# Patient Record
Sex: Female | Born: 1985
Health system: Southern US, Community
[De-identification: ages and names within clinical notes are randomized; demographics above are authoritative.]

## PROBLEM LIST (undated history)

## (undated) DIAGNOSIS — C539 Malignant neoplasm of cervix uteri, unspecified: Secondary | ICD-10-CM

## (undated) HISTORY — DX: Malignant neoplasm of cervix uteri, unspecified: C53.9

---

## 2019-09-02 ENCOUNTER — Ambulatory Visit: Payer: Self-pay

## 2020-02-01 DIAGNOSIS — K0889 Other specified disorders of teeth and supporting structures: Secondary | ICD-10-CM | POA: Diagnosis not present

## 2020-08-18 ENCOUNTER — Encounter: Payer: Self-pay | Admitting: Sports Medicine

## 2020-08-25 ENCOUNTER — Encounter: Payer: BC Managed Care – PPO | Admitting: Sports Medicine

## 2020-08-25 ENCOUNTER — Ambulatory Visit (INDEPENDENT_AMBULATORY_CARE_PROVIDER_SITE_OTHER): Payer: BC Managed Care – PPO

## 2020-08-25 ENCOUNTER — Ambulatory Visit: Payer: BC Managed Care – PPO | Admitting: Sports Medicine

## 2020-08-25 ENCOUNTER — Other Ambulatory Visit: Payer: Self-pay

## 2020-08-25 DIAGNOSIS — S8991XA Unspecified injury of right lower leg, initial encounter: Secondary | ICD-10-CM

## 2020-08-25 DIAGNOSIS — M1711 Unilateral primary osteoarthritis, right knee: Secondary | ICD-10-CM | POA: Insufficient documentation

## 2020-08-25 DIAGNOSIS — M25562 Pain in left knee: Secondary | ICD-10-CM | POA: Diagnosis not present

## 2020-08-25 DIAGNOSIS — M1712 Unilateral primary osteoarthritis, left knee: Secondary | ICD-10-CM | POA: Diagnosis not present

## 2020-08-25 DIAGNOSIS — Z09 Encounter for follow-up examination after completed treatment for conditions other than malignant neoplasm: Secondary | ICD-10-CM | POA: Diagnosis not present

## 2020-08-25 DIAGNOSIS — M25561 Pain in right knee: Secondary | ICD-10-CM | POA: Diagnosis not present

## 2020-08-25 DIAGNOSIS — W19XXXA Unspecified fall, initial encounter: Secondary | ICD-10-CM

## 2020-08-25 NOTE — Progress Notes (Signed)
    Procedures performed today:    None.  Independent interpretation of notes and tests performed by another provider:   None.  Brief History, Exam, Impression, and Recommendations:    Right knee injury This is a pleasant 35 year old female, 2 weeks ago she was on a boat, and excellently twisted her knee.  She had some pain, the next day she had significant swelling. Today on exam she has tenderness at the medial joint line, pain with terminal flexion, she has an effusion, the knee is to some degree unstable and does have a positive McMurray's sign. Due to current mechanical symptoms, positive McMurray's sign, joint line pain we agreed to proceed with x-rays and an MRI for concern for a meniscal tear. Continue over-the-counter NSAIDs as desired and follow-up with me to go over MRI results. If the small tear or chondral defect we can consider an injection, if larger meniscal tear or ACL injury we will need to refer for operative intervention.    ___________________________________________ Gwen Her. Dianah Field, M.D., ABFM., CAQSM. Primary Care and [place] Instructor of [place] of [facility] [employer]

## 2020-08-25 NOTE — Assessment & Plan Note (Signed)
This is a pleasant 35 year old female, 2 weeks ago she was on a boat, and excellently twisted her knee.  She had some pain, the next day she had significant swelling. Today on exam she has tenderness at the medial joint line, pain with terminal flexion, she has an effusion, the knee is to some degree unstable and does have a positive McMurray's sign. Due to current mechanical symptoms, positive McMurray's sign, joint line pain we agreed to proceed with x-rays and an MRI for concern for a meniscal tear. Continue over-the-counter NSAIDs as desired and follow-up with me to go over MRI results. If the small tear or chondral defect we can consider an injection, if larger meniscal tear or ACL injury we will need to refer for operative intervention.

## 2020-08-26 ENCOUNTER — Ambulatory Visit (INDEPENDENT_AMBULATORY_CARE_PROVIDER_SITE_OTHER): Payer: BC Managed Care – PPO

## 2020-08-26 DIAGNOSIS — S8991XA Unspecified injury of right lower leg, initial encounter: Secondary | ICD-10-CM

## 2020-08-26 DIAGNOSIS — M25561 Pain in right knee: Secondary | ICD-10-CM | POA: Diagnosis not present

## 2020-09-06 ENCOUNTER — Ambulatory Visit: Payer: BC Managed Care – PPO | Admitting: Sports Medicine

## 2020-09-06 ENCOUNTER — Other Ambulatory Visit: Payer: Self-pay

## 2020-09-06 ENCOUNTER — Ambulatory Visit: Payer: Self-pay

## 2020-09-06 DIAGNOSIS — S8991XD Unspecified injury of right lower leg, subsequent encounter: Secondary | ICD-10-CM

## 2020-09-06 DIAGNOSIS — M1711 Unilateral primary osteoarthritis, right knee: Secondary | ICD-10-CM

## 2020-09-06 MED ORDER — MELOXICAM 15 MG PO TABS: ORAL_TABLET | ORAL | 3 refills | Status: AC

## 2020-09-06 NOTE — Assessment & Plan Note (Addendum)
Kara Herring is a pleasant 35 year old female, we have been seeing her for knee pain, she recalls accidentally twisting her knee while on a boat, at the last visit we suspected a meniscal tear so we obtained an MRI, MRI only showed osteoarthritis. She used over-the-counter NSAIDs, switching to meloxicam, I have talked to her about an anti-inflammatory diet/Mediterranean diet. Today we also injected her knee. Return to see me in a month to see how things are going, viscosupplementation if no better.  She also plans to establish care with one of the primary care providers here, they can help her work aggressively on weight loss which will be crucial for saving her knee.

## 2020-09-06 NOTE — Progress Notes (Signed)
    Procedures performed today:    Procedure: Real-time Ultrasound Guided injection of the right knee Device: Samsung HS60  Verbal informed consent obtained.  Time-out conducted.  Noted no overlying erythema, induration, or other signs of local infection.  Skin prepped in a sterile fashion.  Local anesthesia: Topical Ethyl chloride.  With sterile technique and under real time ultrasound guidance:  Noted trace effusion, 1 cc Kenalog 40, 2 cc lidocaine, 2 cc bupivacaine injected easily Completed without difficulty  Advised to call if fevers/chills, erythema, induration, drainage, or persistent bleeding.  Images permanently stored and available for review in PACS.  Impression: Technically successful ultrasound guided injection.  Independent interpretation of notes and tests performed by another provider:   None.  Brief History, Exam, Impression, and Recommendations:    Primary osteoarthritis of right knee Kara Herring is a pleasant 35 year old female, we have been seeing her for knee pain, she recalls accidentally twisting her knee while on a boat, at the last visit we suspected a meniscal tear so we obtained an MRI, MRI only showed osteoarthritis. She used over-the-counter NSAIDs, switching to meloxicam, I have talked to her about an anti-inflammatory diet/Mediterranean diet. Today we also injected her knee. Return to see me in a month to see how things are going, viscosupplementation if no better.  She also plans to establish care with one of the primary care providers here, they can help her work aggressively on weight loss which will be crucial for saving her knee.    ___________________________________________ Gwen Her. Dianah Field, M.D., ABFM., CAQSM. Primary Care and [place] Instructor of [place] of [facility] of [employer]

## 2020-09-22 ENCOUNTER — Ambulatory Visit: Payer: BC Managed Care – PPO | Admitting: Medical-Surgical

## 2020-10-04 ENCOUNTER — Ambulatory Visit: Payer: BC Managed Care – PPO | Admitting: Sports Medicine

## 2020-10-13 ENCOUNTER — Ambulatory Visit: Payer: BC Managed Care – PPO | Admitting: Medical-Surgical

## 2020-11-20 DIAGNOSIS — J029 Acute pharyngitis, unspecified: Secondary | ICD-10-CM | POA: Diagnosis not present

## 2021-02-23 DIAGNOSIS — Z0100 Encounter for examination of eyes and vision without abnormal findings: Secondary | ICD-10-CM | POA: Diagnosis not present

## 2022-01-25 DIAGNOSIS — J019 Acute sinusitis, unspecified: Secondary | ICD-10-CM | POA: Diagnosis not present

## 2022-01-31 ENCOUNTER — Encounter: Payer: BC Managed Care – PPO | Admitting: Sports Medicine

## 2022-04-22 IMAGING — DX DG KNEE COMPLETE 4+V*R*
4 series · 4 of 4 positions shown · non-contrast
Comparison: None.

CLINICAL DATA: Right knee pain following fall, initial encounter

EXAM:
RIGHT KNEE - COMPLETE 4+ VIEW

[knee lat]
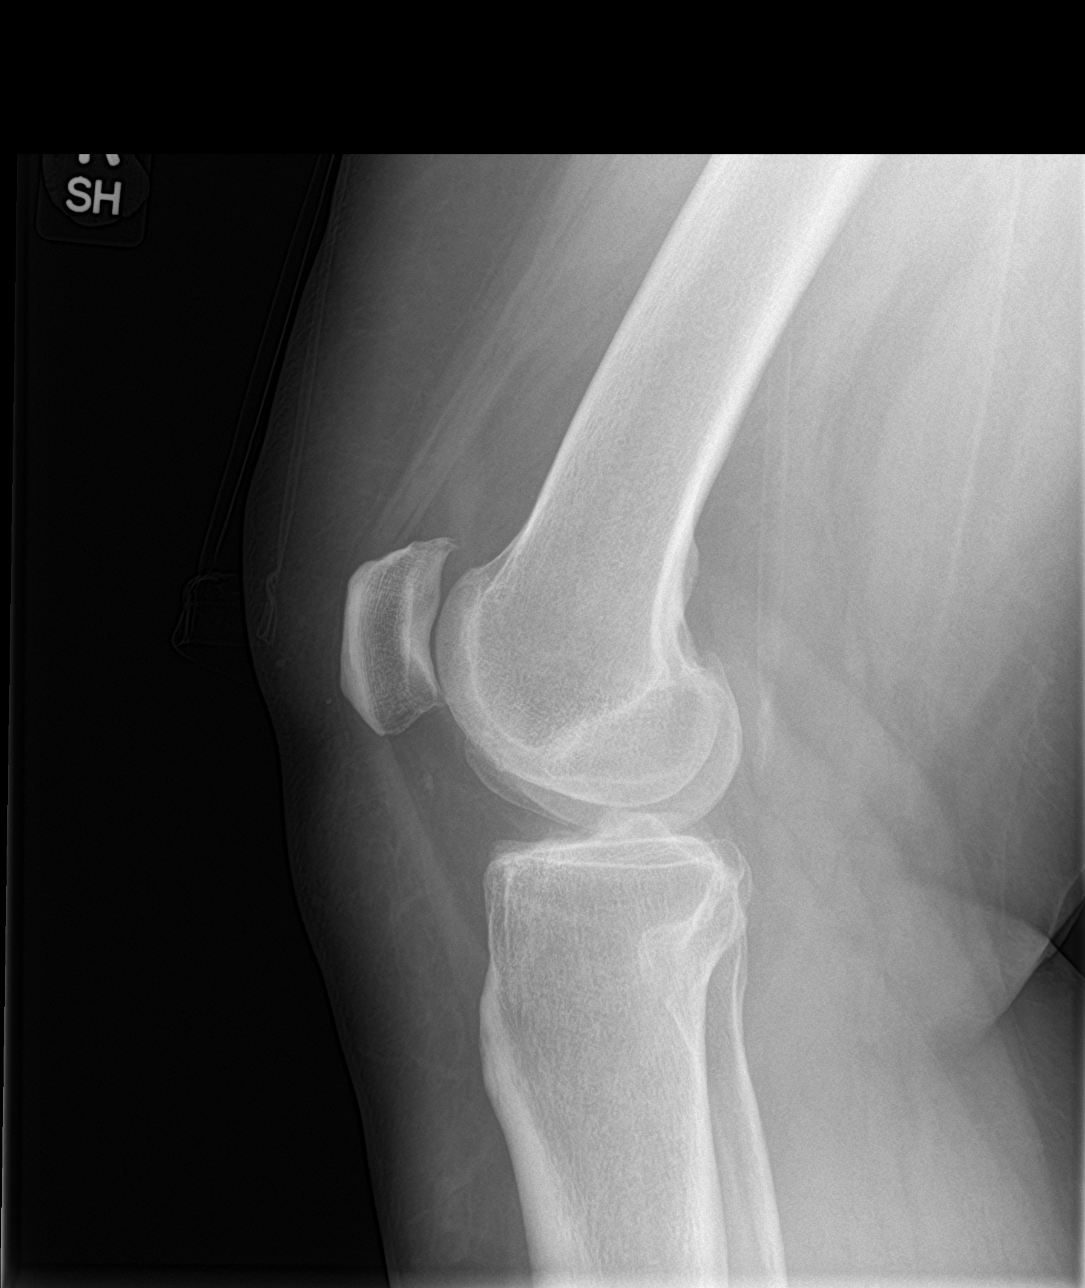

[knee sunrise]
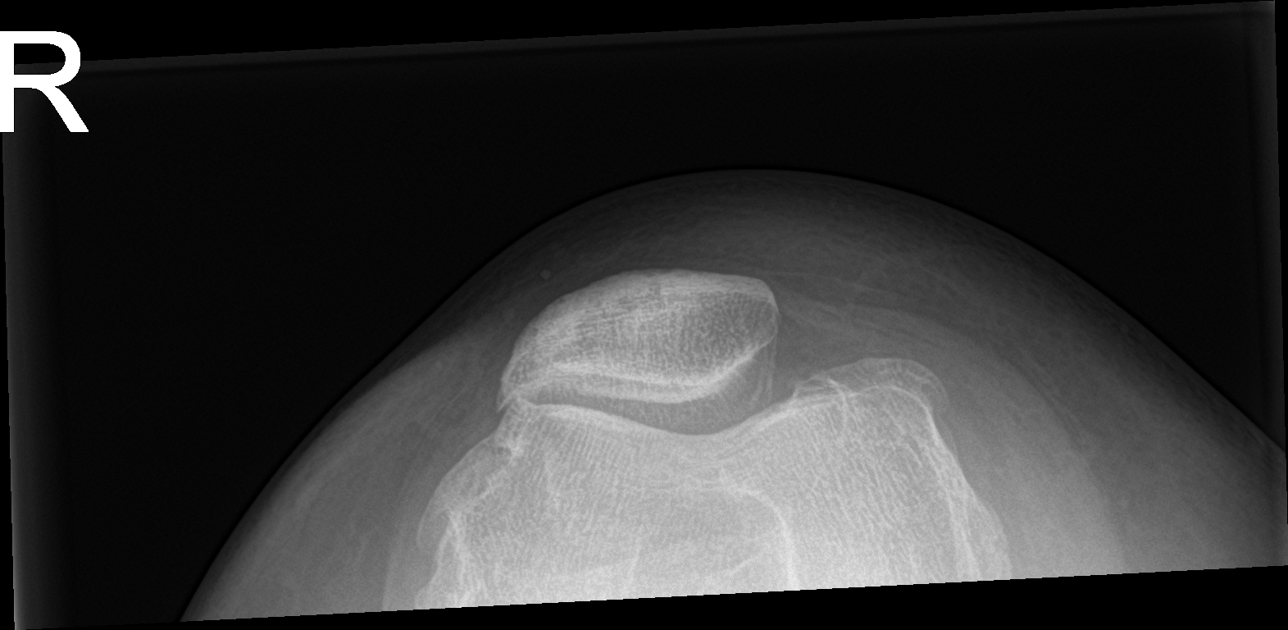

[knee ap bilat standing (1 of 2)]
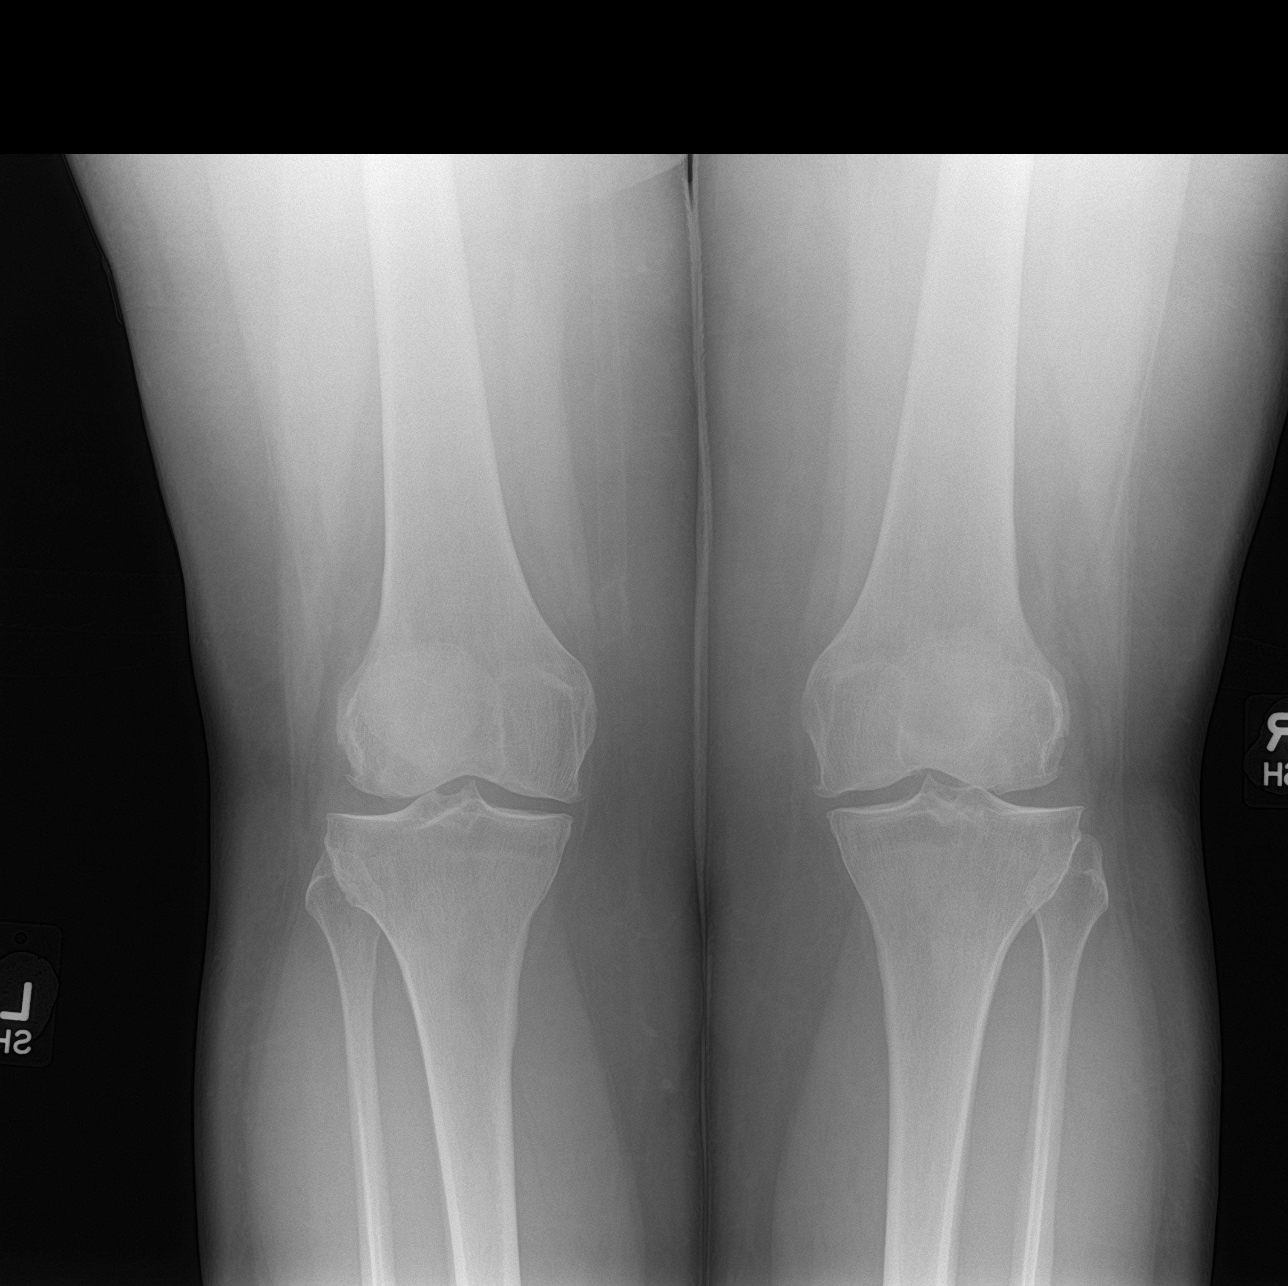

[knee ap bilat standing (2 of 2)]
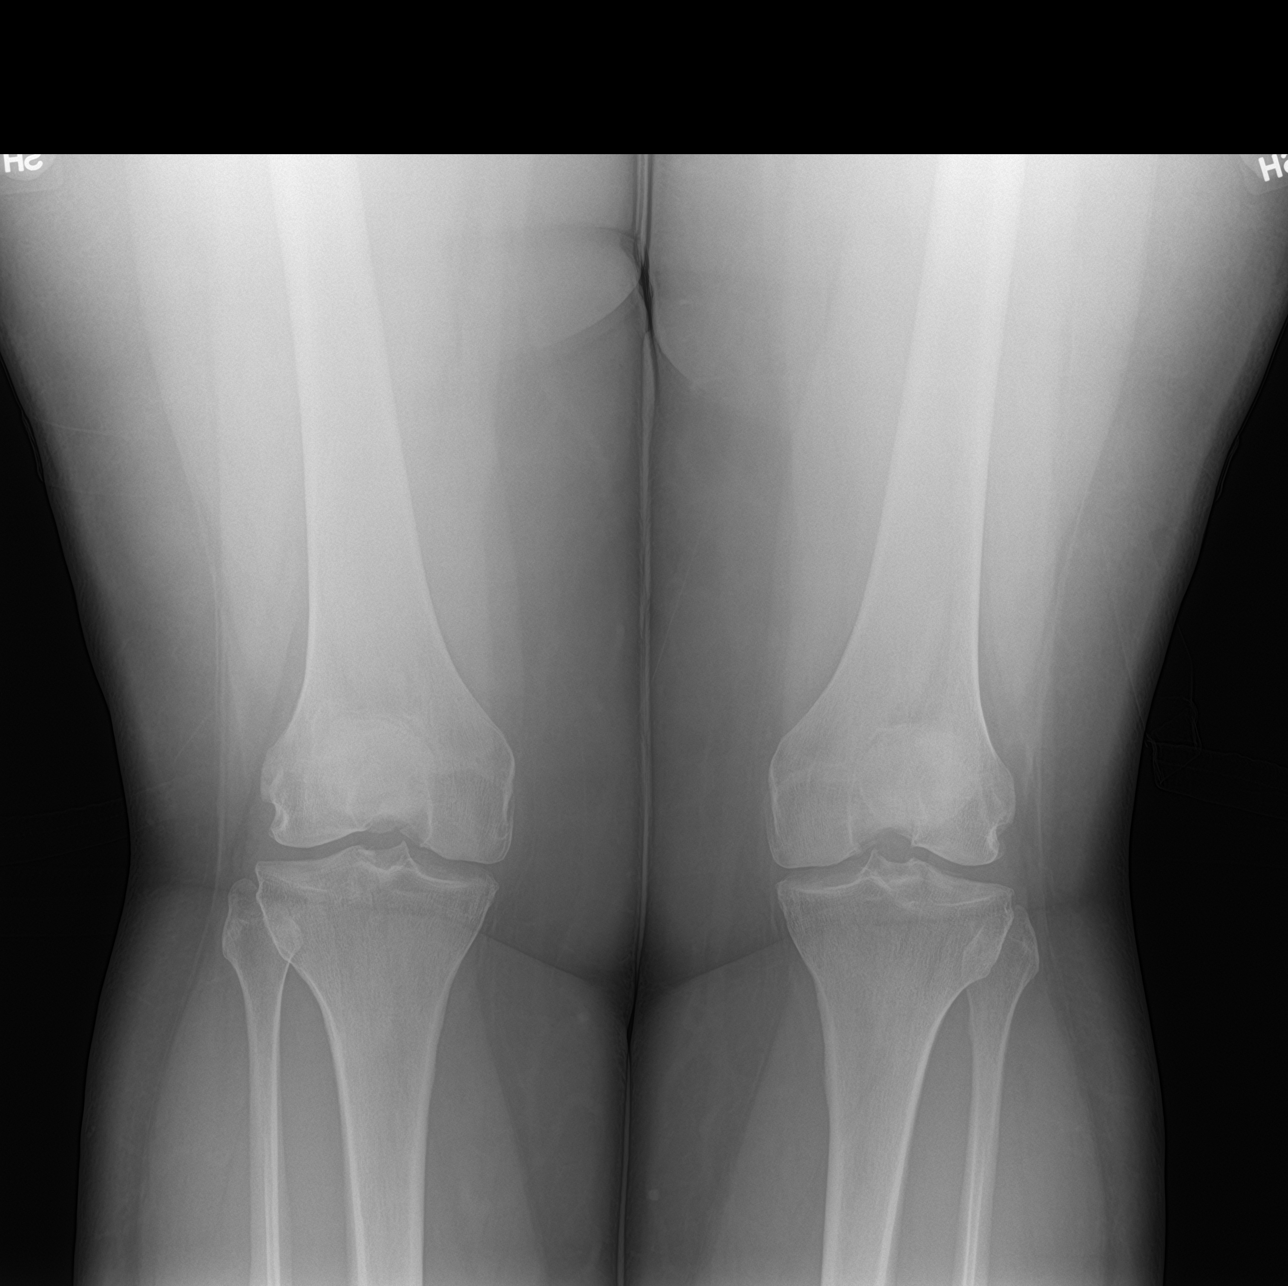

[4 of 4 positions shown; findings below may reference images not displayed]

FINDINGS: Tricompartmental degenerative changes are noted. No joint effusion
is seen. No soft tissue abnormality is noted. No acute fracture is
seen.
IMPRESSION: Degenerative change without acute abnormality.

## 2022-07-02 DIAGNOSIS — R509 Fever, unspecified: Secondary | ICD-10-CM | POA: Diagnosis not present

## 2022-11-09 DIAGNOSIS — J029 Acute pharyngitis, unspecified: Secondary | ICD-10-CM | POA: Diagnosis not present

## 2023-02-20 DIAGNOSIS — R7989 Other specified abnormal findings of blood chemistry: Secondary | ICD-10-CM | POA: Diagnosis not present

## 2023-02-20 DIAGNOSIS — E611 Iron deficiency: Secondary | ICD-10-CM | POA: Diagnosis not present

## 2023-02-20 DIAGNOSIS — N951 Menopausal and female climacteric states: Secondary | ICD-10-CM | POA: Diagnosis not present

## 2023-02-20 DIAGNOSIS — Z131 Encounter for screening for diabetes mellitus: Secondary | ICD-10-CM | POA: Diagnosis not present

## 2023-02-20 DIAGNOSIS — R635 Abnormal weight gain: Secondary | ICD-10-CM | POA: Diagnosis not present

## 2023-02-20 DIAGNOSIS — E7800 Pure hypercholesterolemia, unspecified: Secondary | ICD-10-CM | POA: Diagnosis not present

## 2023-02-25 DIAGNOSIS — N951 Menopausal and female climacteric states: Secondary | ICD-10-CM | POA: Diagnosis not present

## 2023-02-25 DIAGNOSIS — Z1331 Encounter for screening for depression: Secondary | ICD-10-CM | POA: Diagnosis not present

## 2023-02-25 DIAGNOSIS — E7800 Pure hypercholesterolemia, unspecified: Secondary | ICD-10-CM | POA: Diagnosis not present

## 2023-02-25 DIAGNOSIS — R7989 Other specified abnormal findings of blood chemistry: Secondary | ICD-10-CM | POA: Diagnosis not present

## 2023-02-25 DIAGNOSIS — Z6837 Body mass index (BMI) 37.0-37.9, adult: Secondary | ICD-10-CM | POA: Diagnosis not present

## 2023-03-05 DIAGNOSIS — Z6837 Body mass index (BMI) 37.0-37.9, adult: Secondary | ICD-10-CM | POA: Diagnosis not present

## 2023-03-05 DIAGNOSIS — E7800 Pure hypercholesterolemia, unspecified: Secondary | ICD-10-CM | POA: Diagnosis not present

## 2023-03-17 DIAGNOSIS — E7800 Pure hypercholesterolemia, unspecified: Secondary | ICD-10-CM | POA: Diagnosis not present

## 2023-03-17 DIAGNOSIS — Z6836 Body mass index (BMI) 36.0-36.9, adult: Secondary | ICD-10-CM | POA: Diagnosis not present

## 2023-03-27 DIAGNOSIS — Z6835 Body mass index (BMI) 35.0-35.9, adult: Secondary | ICD-10-CM | POA: Diagnosis not present

## 2023-03-27 DIAGNOSIS — E611 Iron deficiency: Secondary | ICD-10-CM | POA: Diagnosis not present

## 2023-03-27 DIAGNOSIS — E7800 Pure hypercholesterolemia, unspecified: Secondary | ICD-10-CM | POA: Diagnosis not present

## 2023-04-03 DIAGNOSIS — E7800 Pure hypercholesterolemia, unspecified: Secondary | ICD-10-CM | POA: Diagnosis not present

## 2023-04-03 DIAGNOSIS — Z6835 Body mass index (BMI) 35.0-35.9, adult: Secondary | ICD-10-CM | POA: Diagnosis not present

## 2023-04-11 DIAGNOSIS — E611 Iron deficiency: Secondary | ICD-10-CM | POA: Diagnosis not present

## 2023-04-11 DIAGNOSIS — Z6835 Body mass index (BMI) 35.0-35.9, adult: Secondary | ICD-10-CM | POA: Diagnosis not present

## 2023-10-24 ENCOUNTER — Encounter (HOSPITAL_BASED_OUTPATIENT_CLINIC_OR_DEPARTMENT_OTHER): Payer: Self-pay | Admitting: Emergency Medicine

## 2023-10-24 ENCOUNTER — Emergency Department (HOSPITAL_BASED_OUTPATIENT_CLINIC_OR_DEPARTMENT_OTHER)
Admission: EM | Admit: 2023-10-24 | Discharge: 2023-10-24 | Disposition: A | Discharge status: Home / Self Care | Payer: Worker's Compensation | Source: Ambulatory Visit | Attending: Emergency Medicine | Admitting: Emergency Medicine

## 2023-10-24 ENCOUNTER — Other Ambulatory Visit: Payer: Self-pay

## 2023-10-24 ENCOUNTER — Emergency Department (HOSPITAL_BASED_OUTPATIENT_CLINIC_OR_DEPARTMENT_OTHER): Payer: Worker's Compensation

## 2023-10-24 DIAGNOSIS — R2241 Localized swelling, mass and lump, right lower limb: Secondary | ICD-10-CM | POA: Diagnosis not present

## 2023-10-24 DIAGNOSIS — M7989 Other specified soft tissue disorders: Secondary | ICD-10-CM

## 2023-10-24 DIAGNOSIS — M79661 Pain in right lower leg: Secondary | ICD-10-CM | POA: Diagnosis present

## 2023-10-24 NOTE — ED Triage Notes (Signed)
 F/u with ortho Concern with some swelling and pain in right calf. Sent for US  to r/o dvt Denies sob or chest pain

## 2023-10-24 NOTE — Discharge Instructions (Signed)
 Ultrasound did not show any signs of deep venous thrombosis/blood clot.  Follow-up with your orthopedic doctor as planned

## 2023-10-24 NOTE — ED Provider Notes (Signed)
 [facility] AT [facility] Provider Note   CSN: [id number] Arrival date & time: 10/24/23  1633     Patient presents with: Leg Pain   Kara Herring is a 38 y.o. female.    Leg Pain    Patient states she was sent to the ED for evaluation of possible DVT by her orthopedic doctor.  Patient states she injured her ankle several months ago.  She has been following up with orthopedic doctor.  Patient states that she has had some intermittent discomfort in her calf.  She was seen in the office today and the doctor was concerned about the possibility of DVT.  Patient denies any severe pain in her leg.  She is not have any fevers or chills.  No chest pain or shortness of breath.  Patient states she has not noticed any significant change in her symptoms but she thinks her doctor is being cautious because of her persistent symptoms  Prior to Admission medications   Medication Sig Start Date End Date Taking? Authorizing Provider  cetirizine (ZYRTEC) 10 MG tablet Take 10 mg by mouth daily.    [provider]  meloxicam  (MOBIC ) 15 MG tablet One tab PO qAM with a meal for 2 weeks, then daily prn pain. 09/06/20   Curtis Debby PARAS, MD    Allergies: Penicillins    Review of Systems  Updated Vital Signs BP 112/81 (BP Location: Right Arm)   Pulse 70   Temp 98.9 F (37.2 C)   Resp 16   LMP 10/07/2023   SpO2 100%   Physical Exam Vitals and nursing note reviewed.  Constitutional:      General: She is not in acute distress.    Appearance: She is well-developed.  HENT:     Head: Normocephalic and atraumatic.     Right Ear: External ear normal.     Left Ear: External ear normal.  Eyes:     General: No scleral icterus.       Right eye: No discharge.        Left eye: No discharge.     Conjunctiva/sclera: Conjunctivae normal.  Neck:     Trachea: No tracheal deviation.  Cardiovascular:     Rate and Rhythm: Normal rate.  Pulmonary:     Effort:  Pulmonary effort is normal. No respiratory distress.     Breath sounds: No stridor.  Abdominal:     General: There is no distension.  Musculoskeletal:        General: No swelling or deformity.     Cervical back: Neck supple.     Right lower leg: No edema.     Left lower leg: No edema.  Skin:    General: Skin is warm and dry.     Findings: No rash.  Neurological:     Mental Status: She is alert. Mental status is at baseline.     Cranial Nerves: No dysarthria or facial asymmetry.     Motor: No seizure activity.     (all labs ordered are listed, but only abnormal results are displayed) Labs Reviewed - No data to display  EKG: None  Radiology: US  Venous Img Lower Unilateral Right Result Date: 10/24/2023 CLINICAL DATA:  Right ankle swelling EXAM: RIGHT LOWER EXTREMITY VENOUS DOPPLER ULTRASOUND TECHNIQUE: Gray-scale sonography with compression, as well as color and duplex ultrasound, were performed to evaluate the deep venous system(s) from the level of the common femoral vein through the popliteal and proximal calf veins. COMPARISON:  None Available. FINDINGS: VENOUS Normal compressibility of the common femoral, superficial femoral, and popliteal veins, as well as the visualized calf veins. Visualized portions of profunda femoral vein and great saphenous vein unremarkable. No filling defects to suggest DVT on grayscale or color Doppler imaging. Doppler waveforms show normal direction of venous flow, normal respiratory plasticity and response to augmentation. Limited views of the contralateral common femoral vein are unremarkable. OTHER None. Limitations: none IMPRESSION: Negative. Electronically Signed   By: Dorethia Molt M.D.   On: 10/24/2023 20:30     Procedures   Medications Ordered in the ED - No data to display                                  Medical Decision Making Problems Addressed: Right leg swelling: acute illness or injury that poses a threat to life or bodily  functions  Amount and/or Complexity of Data Reviewed Radiology: ordered.   Patient presented to the ED for evaluation of possible DVT.  Patient has not had any trouble with any chest pain or shortness of breath.  Patient states her symptoms have been rather mild but her orthopedic doctor was concerned about developing DVT.  No findings to suggest cellulitis on exam.  Doppler study is negative for DVT.  Evaluation and diagnostic testing in the emergency department does not suggest an emergent condition requiring admission or immediate intervention beyond what has been performed at this time.  The patient is safe for discharge and has been instructed to return immediately for worsening symptoms, change in symptoms or any other concerns.     Final diagnoses:  Right leg swelling    ED Discharge Orders     None          Randol Simmonds, MD 10/24/23 2103

## 2023-10-24 NOTE — ED Notes (Signed)
 RN reviewed discharge instructions with pt. Pt verbalized understanding and had no further questions. VSS upon discharge.

## 2023-12-16 ENCOUNTER — Encounter: Payer: Self-pay | Admitting: Sports Medicine
